# Patient Record
Sex: Female | Born: 1955 | Race: Black or African American | Hispanic: No | Smoking: Never smoker
Health system: Southern US, Community
[De-identification: ages and names within clinical notes are randomized; demographics above are authoritative.]

## PROBLEM LIST (undated history)

## (undated) HISTORY — PX: ABDOMINAL HYSTERECTOMY: SHX81

---

## 2012-01-12 ENCOUNTER — Other Ambulatory Visit: Payer: Self-pay | Admitting: Obstetrics and Gynecology

## 2012-01-12 DIAGNOSIS — R928 Other abnormal and inconclusive findings on diagnostic imaging of breast: Secondary | ICD-10-CM

## 2012-01-23 ENCOUNTER — Ambulatory Visit
Admission: RE | Admit: 2012-01-23 | Discharge: 2012-01-23 | Disposition: A | Discharge status: Home / Self Care | Payer: BC Managed Care – PPO | Source: Ambulatory Visit | Attending: Obstetrics and Gynecology | Admitting: Obstetrics and Gynecology

## 2012-01-23 DIAGNOSIS — R928 Other abnormal and inconclusive findings on diagnostic imaging of breast: Secondary | ICD-10-CM

## 2012-12-13 ENCOUNTER — Ambulatory Visit (INDEPENDENT_AMBULATORY_CARE_PROVIDER_SITE_OTHER): Payer: Managed Care, Other (non HMO) | Admitting: Physician Assistant

## 2012-12-13 VITALS — BP 132/82 | HR 66 | Temp 97.9°F | Resp 18 | Ht 65.5 in | Wt 209.0 lb

## 2012-12-13 DIAGNOSIS — R591 Generalized enlarged lymph nodes: Secondary | ICD-10-CM

## 2012-12-13 DIAGNOSIS — R599 Enlarged lymph nodes, unspecified: Secondary | ICD-10-CM

## 2012-12-13 LAB — POCT CBC
HCT, POC: 39.7 % (ref 37.7–47.9)
Hemoglobin: 12.2 g/dL (ref 12.2–16.2)
Lymph, poc: 2.3 (ref 0.6–3.4)
MCHC: 30.7 g/dL — AB (ref 31.8–35.4)
POC Granulocyte: 4.3 (ref 2–6.9)

## 2012-12-13 MED ORDER — DOXYCYCLINE HYCLATE 100 MG PO CAPS: 100.0000 mg | ORAL_CAPSULE | Freq: Two times a day (BID) | ORAL | Status: AC

## 2012-12-13 NOTE — Progress Notes (Signed)
Patient ID: Caroline Moody MRN: [id number], DOB: Nov 12, 1955, 57 y.o. Date of Encounter: 12/13/2012, 10:57 AM  Primary Physician: No PCP Per Patient  Chief Complaint: Left throat swelling x 3 days  HPI: 57 y.o. female with history below presents with 3 day history of left sided throat swelling. Swelling is located along the underside of her chin. Swelling is waxing and waning. She states this morning when she woke up it had improved from the previous day. States it feels like at "itch." Radiates up to the left ear. Does not feel like she has a sore throat. No URI symptoms. No change in the character or quality of her voice. Afebrile. No chills. No tobacco use. Rarely drinks alcohol. She did apply some ice to the area over the past weekend. Generally healthy.    History reviewed. No pertinent past medical history.   Home Meds: Prior to Admission medications   Medication Sig Start Date End Date Taking? Authorizing Provider           Allergies: No Known Allergies  History   Social History  . Marital Status: Divorced    Spouse Name: N/A    Number of Children: N/A  . Years of Education: N/A   Occupational History  . Not on file.   Social History Main Topics  . Smoking status: Never Smoker   . Smokeless tobacco: Not on file  . Alcohol Use: Yes  . Drug Use: No  . Sexually Active: Not on file   Other Topics Concern  . Not on file   Social History Narrative  . No narrative on file     Review of Systems: Constitutional: negative for chills, fever, or fatigue  HEENT: see above Cardiovascular: negative for chest pain or palpitations Respiratory: negative for wheezing, shortness of breath, or cough Abdominal: negative for abdominal pain, nausea, vomiting, or diarrhea Dermatological: negative for rash Neurologic: negative for headache   Physical Exam: Blood pressure 132/82, pulse 66, temperature 97.9 F (36.6 C), temperature source Oral, resp. rate 18, height 5' 5.5" (1.664  m), weight 209 lb (94.802 kg), SpO2 99.00%., Body mass index is 34.24 kg/(m^2). General: Well developed, well nourished, in no acute distress. Head: Normocephalic, atraumatic, eyes without discharge, sclera non-icteric, nares are without discharge. Bilateral auditory canals clear, TM's are without perforation, pearly grey and translucent with reflective cone of light bilaterally. Oral cavity moist, posterior pharynx without exudate, erythema, peritonsillar abscess, or post nasal drip.  Neck: Supple. No thyromegaly. Full ROM. Lymph nodes: Left sub mandibular less than 2 cm mobile with mild TTP. Lungs: Clear bilaterally to auscultation without wheezes, rales, or rhonchi. Breathing is unlabored. Heart: RRR with S1 S2. No murmurs, rubs, or gallops appreciated. Msk:  Strength and tone normal for age. Extremities/Skin: Warm and dry. No clubbing or cyanosis. No edema. No rashes or suspicious lesions. Neuro: Alert and oriented X 3. Moves all extremities spontaneously. Gait is normal. CNII-XII grossly in tact. Psych:  Responds to questions appropriately with a normal affect.   Labs: Results for orders placed in visit on 12/13/12  POCT CBC      Result Value Range   WBC 7.2  4.6 - 10.2 K/uL   Lymph, poc 2.3  0.6 - 3.4   POC LYMPH PERCENT 31.6  10 - 50 %L   MID (cbc) 0.6  0 - 0.9   POC MID % 8.9  0 - 12 %M   POC Granulocyte 4.3  2 - 6.9   Granulocyte percent 59.5  37 - 80 %G   RBC 4.48  4.04 - 5.48 M/uL   Hemoglobin 12.2  12.2 - 16.2 g/dL   HCT, POC 96.0  45.4 - 47.9 %   MCV 88.7  80 - 97 fL   MCH, POC 27.2  27 - 31.2 pg   MCHC 30.7 (*) 31.8 - 35.4 g/dL   RDW, POC 09.8     Platelet Count, POC 278  142 - 424 K/uL   MPV 11.2  0 - 99.8 fL     ASSESSMENT AND PLAN:  57 y.o. female with lymphadenopathy  -Doxycycline 100 mg 1 po bid #20 no RF -Warm compresses -Call if persists or worsens  -Ultrasound if persists  -RTC precautions  Signed, Eula Listen, PA-C 12/13/2012 10:57 AM

## 2014-04-10 ENCOUNTER — Other Ambulatory Visit: Payer: Self-pay | Admitting: Obstetrics and Gynecology

## 2014-04-10 DIAGNOSIS — N63 Unspecified lump in unspecified breast: Secondary | ICD-10-CM

## 2014-04-14 ENCOUNTER — Ambulatory Visit
Admission: RE | Admit: 2014-04-14 | Discharge: 2014-04-14 | Disposition: A | Discharge status: Home / Self Care | Payer: Commercial Indemnity | Source: Ambulatory Visit | Attending: Obstetrics and Gynecology | Admitting: Obstetrics and Gynecology

## 2014-04-14 ENCOUNTER — Encounter (INDEPENDENT_AMBULATORY_CARE_PROVIDER_SITE_OTHER): Payer: Self-pay

## 2014-04-14 DIAGNOSIS — N63 Unspecified lump in unspecified breast: Secondary | ICD-10-CM

## 2015-10-21 IMAGING — MG MM DIGITAL DIAGNOSTIC BILAT
5 series · 5 of 5 positions shown · non-contrast
Comparison: Previous mammogram dated 12/31/2011.

CLINICAL DATA: 58-year-old female with a palpable abnormality in
the outer left breast.

EXAM:
DIGITAL DIAGNOSTIC  BILATERAL MAMMOGRAM WITH CAD
ULTRASOUND LEFT BREAST

[L MLO]
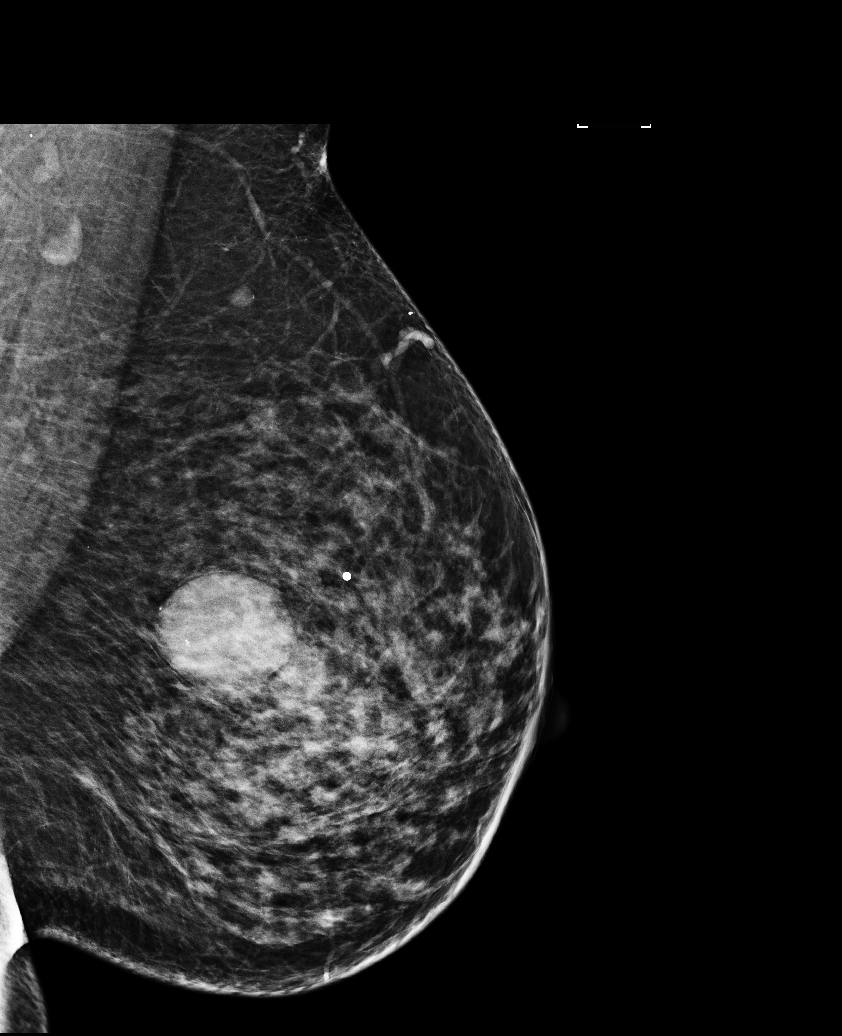

[R MLO]
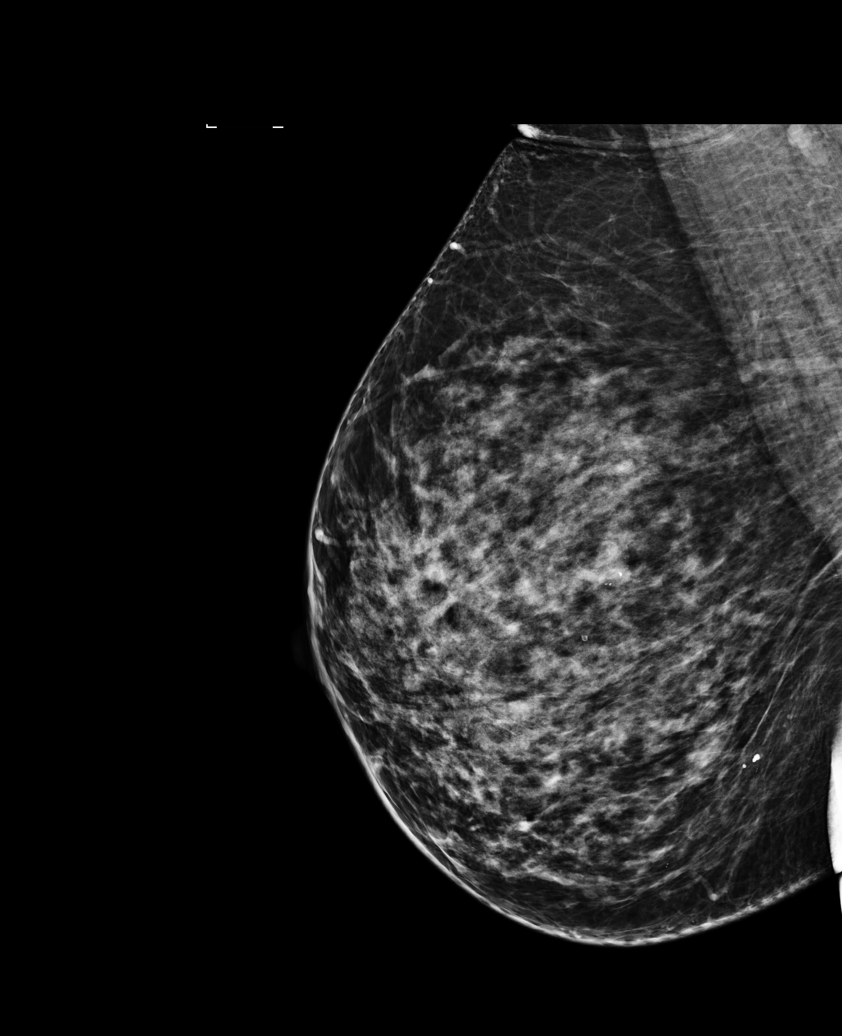

[R CC]
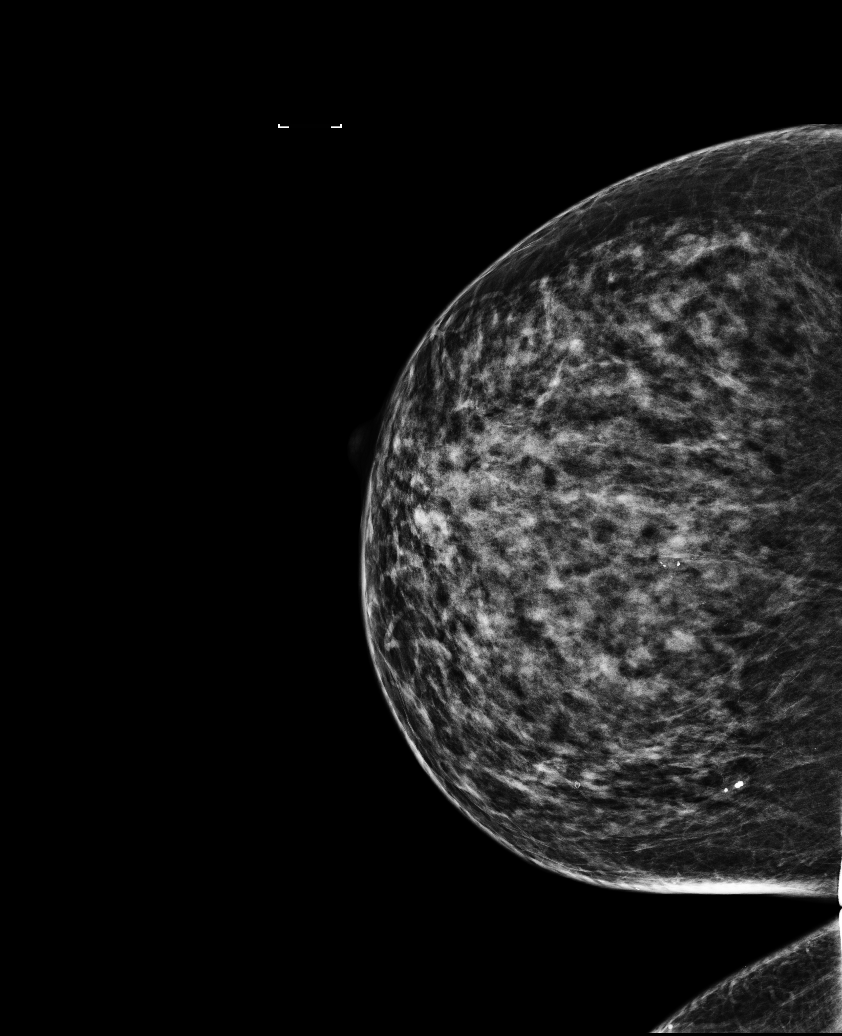

[L CC]
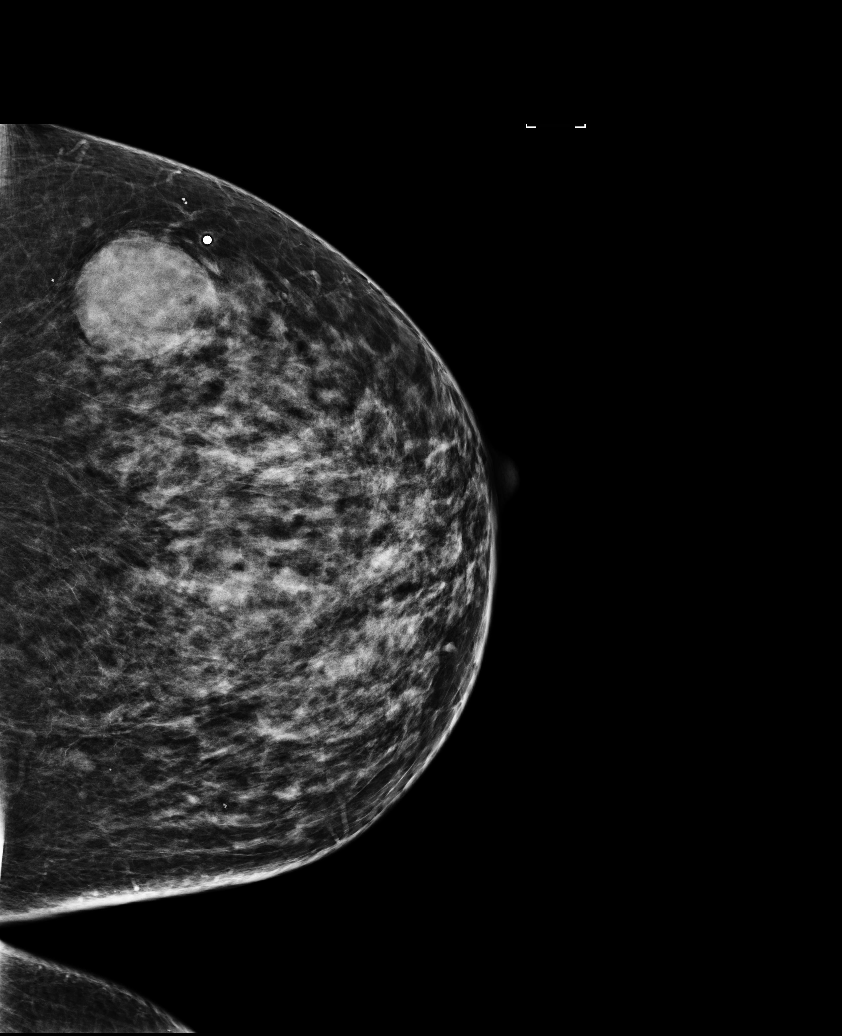

[L TAN]
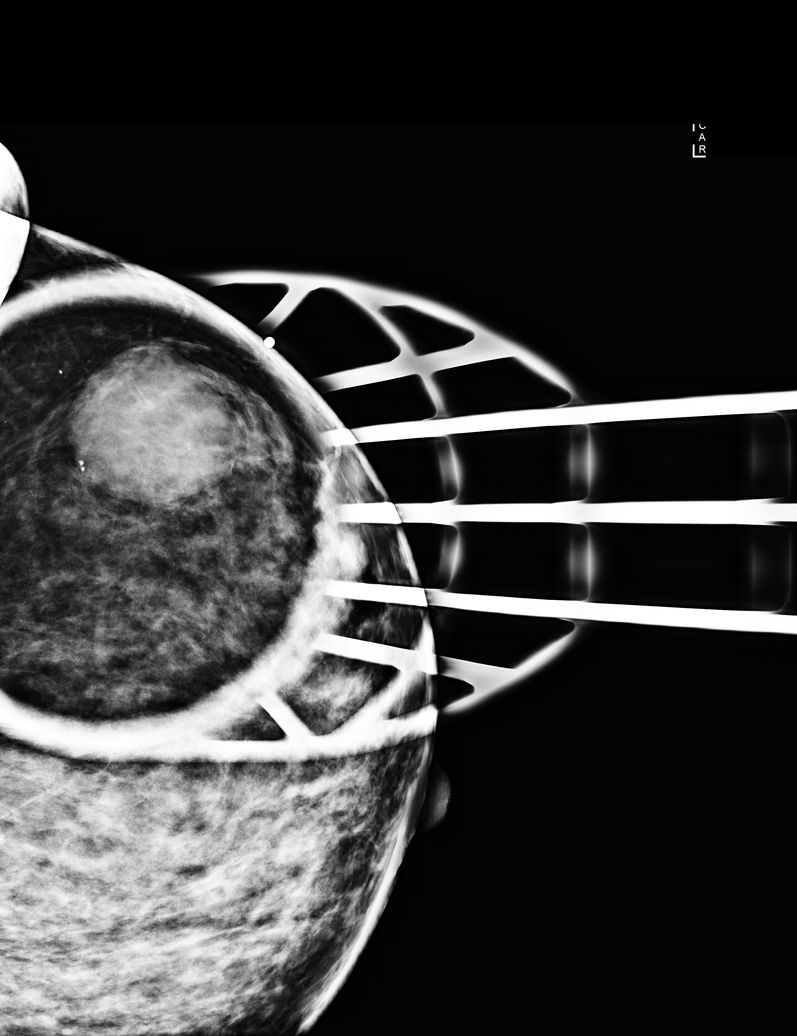

[5 of 5 positions shown; findings below may reference images not displayed]

ACR Breast Density Category c: The breast tissue is heterogeneously
dense, which may obscure small masses.
FINDINGS: No suspicious masses or calcifications are seen in either breast. A
spot compression tangential view was performed over the palpable
site of concern in the outer left breast demonstrating an oval
well-circumscribed dense mass measuring approximately 3.4 cm,
unchanged in size and appearance dating back to 12/31/2011 and
previously characterized as a cyst.

Mammographic images were processed with CAD.

Physical examination at site of palpable concern in the outer left
breast reveals a firm mobile nodule at the approximate 3 o'clock
position.

Targeted ultrasound of the left breast was performed demonstrating
an oval well-circumscribed anechoic mass with posterior acoustic
enhancement compatible with a cyst at 3 o'clock 6-7 cm from the
nipple measuring 2.8 x 2.4 by 3 cm.
IMPRESSION: Left breast cyst. There is no mammographic evidence of malignancy in
either breast.

RECOMMENDATION:
Screening mammogram in one year.(Code:AZ-C-QPJ)

I have discussed the findings and recommendations with the patient.
Results were also provided in writing at the conclusion of the
visit. If applicable, a reminder letter will be sent to the patient
regarding the next appointment.

BI-RADS CATEGORY  2: Benign.

## 2022-03-25 ENCOUNTER — Other Ambulatory Visit: Payer: Self-pay | Admitting: Obstetrics and Gynecology

## 2022-03-25 DIAGNOSIS — E049 Nontoxic goiter, unspecified: Secondary | ICD-10-CM

## 2022-03-27 ENCOUNTER — Ambulatory Visit
Admission: RE | Admit: 2022-03-27 | Discharge: 2022-03-27 | Disposition: A | Discharge status: Home / Self Care | Payer: No Typology Code available for payment source | Source: Ambulatory Visit | Attending: Obstetrics and Gynecology | Admitting: Obstetrics and Gynecology

## 2022-03-27 DIAGNOSIS — E049 Nontoxic goiter, unspecified: Secondary | ICD-10-CM

## 2022-04-15 ENCOUNTER — Other Ambulatory Visit: Payer: Self-pay | Admitting: Surgery

## 2022-04-15 DIAGNOSIS — E041 Nontoxic single thyroid nodule: Secondary | ICD-10-CM

## 2022-04-30 ENCOUNTER — Ambulatory Visit
Admission: RE | Admit: 2022-04-30 | Discharge: 2022-04-30 | Disposition: A | Discharge status: Home / Self Care | Payer: No Typology Code available for payment source | Source: Ambulatory Visit | Attending: Surgery | Admitting: Surgery

## 2022-04-30 ENCOUNTER — Other Ambulatory Visit: Payer: Self-pay | Admitting: Internal Medicine

## 2022-04-30 ENCOUNTER — Other Ambulatory Visit (HOSPITAL_COMMUNITY)
Admission: RE | Admit: 2022-04-30 | Discharge: 2022-04-30 | Disposition: A | Discharge status: Home / Self Care | Payer: No Typology Code available for payment source | Source: Ambulatory Visit | Attending: Surgery | Admitting: Surgery

## 2022-04-30 DIAGNOSIS — E041 Nontoxic single thyroid nodule: Secondary | ICD-10-CM | POA: Insufficient documentation

## 2022-04-30 DIAGNOSIS — M810 Age-related osteoporosis without current pathological fracture: Secondary | ICD-10-CM

## 2022-05-02 LAB — CYTOLOGY - NON PAP

## 2022-05-06 NOTE — Progress Notes (Signed)
Good news!  Both nodules are benign on biopsy.  Plan to see patient back in one year with an ultrasound and a TSH level followed by exam in the office.  Kelly - please arrange follow up and notify patient of benign results.  tmg  Valyn Latchford, MD [facility] practice Office: [phone] 

## 2022-05-06 NOTE — Progress Notes (Signed)
Good news!  Both nodules are benign on biopsy.  Plan to see patient back in one year with an ultrasound and a TSH level followed by exam in the office.  Claiborne Billings - please arrange follow up and notify patient of benign results.  [place], MD [facility] [place] practice Office: [phone]

## 2022-11-03 DIAGNOSIS — M79672 Pain in left foot: Secondary | ICD-10-CM | POA: Diagnosis not present

## 2022-11-03 DIAGNOSIS — E669 Obesity, unspecified: Secondary | ICD-10-CM | POA: Diagnosis not present

## 2023-02-18 ENCOUNTER — Other Ambulatory Visit: Payer: Self-pay | Admitting: Surgery

## 2023-02-18 DIAGNOSIS — E042 Nontoxic multinodular goiter: Secondary | ICD-10-CM

## 2023-03-11 ENCOUNTER — Ambulatory Visit
Admission: RE | Admit: 2023-03-11 | Discharge: 2023-03-11 | Disposition: A | Discharge status: Home / Self Care | Payer: BLUE CROSS/BLUE SHIELD | Source: Ambulatory Visit | Attending: Surgery | Admitting: Surgery

## 2023-03-11 DIAGNOSIS — E042 Nontoxic multinodular goiter: Secondary | ICD-10-CM

## 2023-05-07 DIAGNOSIS — E042 Nontoxic multinodular goiter: Secondary | ICD-10-CM | POA: Diagnosis not present

## 2023-05-21 DIAGNOSIS — E042 Nontoxic multinodular goiter: Secondary | ICD-10-CM | POA: Diagnosis not present

## 2023-06-02 DIAGNOSIS — Z13820 Encounter for screening for osteoporosis: Secondary | ICD-10-CM | POA: Diagnosis not present

## 2023-07-07 DIAGNOSIS — E041 Nontoxic single thyroid nodule: Secondary | ICD-10-CM | POA: Diagnosis not present

## 2023-07-07 DIAGNOSIS — R7303 Prediabetes: Secondary | ICD-10-CM | POA: Diagnosis not present

## 2023-07-07 DIAGNOSIS — H6122 Impacted cerumen, left ear: Secondary | ICD-10-CM | POA: Diagnosis not present

## 2023-07-07 DIAGNOSIS — Z0000 Encounter for general adult medical examination without abnormal findings: Secondary | ICD-10-CM | POA: Diagnosis not present

## 2023-07-07 DIAGNOSIS — E785 Hyperlipidemia, unspecified: Secondary | ICD-10-CM | POA: Diagnosis not present

## 2023-07-23 DIAGNOSIS — Z01419 Encounter for gynecological examination (general) (routine) without abnormal findings: Secondary | ICD-10-CM | POA: Diagnosis not present

## 2023-07-23 DIAGNOSIS — R319 Hematuria, unspecified: Secondary | ICD-10-CM | POA: Diagnosis not present

## 2023-07-23 DIAGNOSIS — Z1231 Encounter for screening mammogram for malignant neoplasm of breast: Secondary | ICD-10-CM | POA: Diagnosis not present

## 2023-07-23 DIAGNOSIS — Z6834 Body mass index (BMI) 34.0-34.9, adult: Secondary | ICD-10-CM | POA: Diagnosis not present

## 2023-07-23 DIAGNOSIS — N390 Urinary tract infection, site not specified: Secondary | ICD-10-CM | POA: Diagnosis not present

## 2023-07-23 DIAGNOSIS — Z1151 Encounter for screening for human papillomavirus (HPV): Secondary | ICD-10-CM | POA: Diagnosis not present

## 2023-07-23 DIAGNOSIS — Z1272 Encounter for screening for malignant neoplasm of vagina: Secondary | ICD-10-CM | POA: Diagnosis not present
